# Patient Record
Sex: Female | Born: 1975 | Race: White | Hispanic: No | Marital: Married | Smoking: Never smoker
Health system: Southern US, Community
[De-identification: ages and names within clinical notes are randomized; demographics above are authoritative.]

## PROBLEM LIST (undated history)

## (undated) DIAGNOSIS — S0300XA Dislocation of jaw, unspecified side, initial encounter: Secondary | ICD-10-CM

## (undated) DIAGNOSIS — Z87442 Personal history of urinary calculi: Secondary | ICD-10-CM

## (undated) DIAGNOSIS — Z8489 Family history of other specified conditions: Secondary | ICD-10-CM

## (undated) DIAGNOSIS — R519 Headache, unspecified: Secondary | ICD-10-CM

## (undated) DIAGNOSIS — I10 Essential (primary) hypertension: Secondary | ICD-10-CM

## (undated) DIAGNOSIS — F419 Anxiety disorder, unspecified: Secondary | ICD-10-CM

## (undated) HISTORY — PX: FRACTURE SURGERY: SHX138

---

## 1999-09-26 ENCOUNTER — Other Ambulatory Visit: Admission: RE | Admit: 1999-09-26 | Discharge: 1999-09-26 | Payer: Self-pay | Admitting: Obstetrics and Gynecology

## 2000-10-01 ENCOUNTER — Other Ambulatory Visit: Admission: RE | Admit: 2000-10-01 | Discharge: 2000-10-01 | Payer: Self-pay | Admitting: Obstetrics and Gynecology

## 2001-10-24 ENCOUNTER — Other Ambulatory Visit: Admission: RE | Admit: 2001-10-24 | Discharge: 2001-10-24 | Payer: Self-pay | Admitting: Obstetrics and Gynecology

## 2002-11-18 ENCOUNTER — Other Ambulatory Visit: Admission: RE | Admit: 2002-11-18 | Discharge: 2002-11-18 | Payer: Self-pay | Admitting: Obstetrics and Gynecology

## 2003-12-03 ENCOUNTER — Other Ambulatory Visit: Admission: RE | Admit: 2003-12-03 | Discharge: 2003-12-03 | Payer: Self-pay | Admitting: Obstetrics and Gynecology

## 2009-09-29 ENCOUNTER — Ambulatory Visit (HOSPITAL_COMMUNITY): Admission: RE | Admit: 2009-09-29 | Discharge: 2009-09-29 | Payer: Self-pay | Admitting: Obstetrics and Gynecology

## 2010-07-13 ENCOUNTER — Encounter
Admission: RE | Admit: 2010-07-13 | Discharge: 2010-08-23 | Payer: Self-pay | Source: Home / Self Care | Attending: Obstetrics and Gynecology | Admitting: Obstetrics and Gynecology

## 2010-10-17 LAB — CBC
Hemoglobin: 13.9 g/dL (ref 12.0–15.0)
Platelets: 394 10*3/uL (ref 150–400)
RDW: 12.6 % (ref 11.5–15.5)
WBC: 9.4 10*3/uL (ref 4.0–10.5)

## 2010-10-30 ENCOUNTER — Inpatient Hospital Stay (HOSPITAL_COMMUNITY)
Admission: AD | Admit: 2010-10-30 | Discharge: 2010-11-04 | DRG: 766 | Disposition: A | Discharge status: Home / Self Care | Payer: 59 | Source: Ambulatory Visit | Attending: Obstetrics and Gynecology | Admitting: Obstetrics and Gynecology

## 2010-10-30 DIAGNOSIS — O324XX0 Maternal care for high head at term, not applicable or unspecified: Secondary | ICD-10-CM | POA: Diagnosis present

## 2010-10-30 DIAGNOSIS — Z22330 Carrier of Group B streptococcus: Secondary | ICD-10-CM

## 2010-10-30 DIAGNOSIS — O99892 Other specified diseases and conditions complicating childbirth: Secondary | ICD-10-CM | POA: Diagnosis present

## 2010-10-30 DIAGNOSIS — O99814 Abnormal glucose complicating childbirth: Secondary | ICD-10-CM | POA: Diagnosis present

## 2010-10-30 LAB — CBC
Hemoglobin: 13.2 g/dL (ref 12.0–15.0)
RBC: 4.72 MIL/uL (ref 3.87–5.11)

## 2010-10-30 LAB — RPR: RPR Ser Ql: NONREACTIVE

## 2010-11-01 ENCOUNTER — Other Ambulatory Visit: Payer: Self-pay | Admitting: Obstetrics and Gynecology

## 2010-11-01 LAB — GLUCOSE, CAPILLARY
Glucose-Capillary: 121 mg/dL — ABNORMAL HIGH (ref 70–99)
Glucose-Capillary: 86 mg/dL (ref 70–99)
Glucose-Capillary: 90 mg/dL (ref 70–99)
Glucose-Capillary: 128 mg/dL — ABNORMAL HIGH (ref 70–99)
Glucose-Capillary: 92 mg/dL (ref 70–99)

## 2010-11-02 ENCOUNTER — Inpatient Hospital Stay (HOSPITAL_COMMUNITY): Admission: AD | Admit: 2010-11-02 | Payer: Self-pay | Source: Home / Self Care | Admitting: Obstetrics and Gynecology

## 2010-11-02 LAB — CBC
Hemoglobin: 11.3 g/dL — ABNORMAL LOW (ref 12.0–15.0)
RBC: 4.11 MIL/uL (ref 3.87–5.11)
WBC: 14.8 10*3/uL — ABNORMAL HIGH (ref 4.0–10.5)

## 2010-11-09 NOTE — Discharge Summary (Addendum)
Dawn Thompson, Dawn Thompson            ACCOUNT NO.:  [id number]  MEDICAL RECORD NO.:  [id number]           PATIENT TYPE:  I  LOCATION:  9126                          [facility]  PHYSICIAN:  Maxie Better, M.D.DATE OF BIRTH:  01-02-1976  DATE OF ADMISSION:  10/30/2010 DATE OF DISCHARGE:  11/04/2010                              DISCHARGE SUMMARY   ADMISSION DIAGNOSES: 1. Term intrauterine pregnancy at 39 weeks. 2. Class A1 gestational diabetes. 3. Positive GBS.  DISCHARGE DIAGNOSES: 1. Status post C-section and delivery of a viable female infant. 2. Resolving class A1 gestational diabetes. 3. Fluid retention, mild.  The patient is a G2, P 0-0-1-0, at 39 weeks with an [facility] of November 02, 2010.  She has received prenatal care at [facility] since 9 weeks' gestation with Dr. Cherly Hensen as primary provider.  PRENATAL LABORATORY DATA:  Rh positive, rubella immune, HIV nonreactive, RPR nonreactive, hepatitis B nonreactive, GBS positive, GTT abnormal 1- hour 222, 2-hour 201, 3-hour 141.  Prenatal course has been complicated by gestational diabetes and the patient has been evaluated by a nutritionist and no medications have been administered.  Status has remained diet controlled.  MEDICAL HISTORY:  Significant for migraines.  ALLERGIES:  No known allergies.  CURRENT MEDICATIONS: 1. Prenatal vitamin p.o. daily. 2. Daily Accu-Cheks.  The patient is admitted on October 30, 2010, for induction of labor. Admission labs on October 30, 2010, include CBC, WBC 9.4, hemoglobin 13.2, hematocrit 39.6, and platelets 241.  The patient is admitted for induction of labor and augmented with Pitocin.  Labor progressed to fully dilated and the patient attempted pushing x3 hours. Due to failure to descend, decision was made to proceed with primary C-section.  C- section was performed on November 01, 2010, by Dr. Cherly Hensen.  A viable female infant was delivered weighing 7 pounds and 9 ounces with Apgars of 9  and 9.  Infant was then transferred to [facility] in stable condition.  Postop course has been uneventful with resolving class A1 gestational diabetes.  Postpartum CBC on November 02, 2010, WBC 14.8, hemoglobin 11.3, hematocrit 34.8, and platelets 208.  Vital signs have remained stable and the patient has remained afebrile.  Abdominal incision is low transverse and closed with staples.  We will plan staple removal today prior to discharge home and we will apply benzoin and Steri-Strips.  Newborn infant is breast and bottle feeding. Circumcision has been completed.  Status at the time of discharge for the patient is stable.  She is tolerating a regular diet, voiding without any problems, positive for flatus.  Activity ad lib.  She has been given the [facility] postpartum booklet and advised to follow up for routine postpartum visit in 6 weeks.  MEDICATIONS AT DISCHARGE: 1. Percocet 5/325 one to two p.o. q.6 h. p.r.n. pain, dispensed #30,     no refill. 2. HCTZ 25 mg p.o. daily x5 days.  Dawn Lindau, NP   ______________________________ Maxie Better, M.D.    JF/MEDQ  D:  11/04/2010  T:  11/05/2010  Job:  [id number]  Electronically Signed by Nena Jordan COUSINS M.D. on 11/09/2010 03:11:13 PM Electronically Signed by  Aroura Vasudevan  on 11/11/2010 08:28:34 AM

## 2010-11-09 NOTE — Op Note (Signed)
  NAME:  Dawn Thompson, Dawn Thompson            ACCOUNT NO.:  [id number]  MEDICAL RECORD NO.:  [id number]           PATIENT TYPE:  I  LOCATION:  9126                          [facility]  PHYSICIAN:  Maxie Better, M.D.DATE OF BIRTH:  [birth year]  DATE OF PROCEDURE:  10/30/2010 DATE OF DISCHARGE:                              OPERATIVE REPORT   PREOPERATIVE DIAGNOSIS:  Arrest of descent, class A1 gestational diabetes, term gestation.  PROCEDURE:  Primary cesarean section, Kerr hysterotomy.  POSTOPERATIVE DIAGNOSIS:  Arrest of descent, direct occiput posterior presentation, class A1 gestational diabetes, term gestation.  ANESTHESIA:  Epidural.  SURGEON:  Maxie Better, MD  ASSISTANT:  Arlan Organ, CNM  PROCEDURE:  Under adequate epidural anesthesia, the patient was placed in the supine position with a left lateral tilt.  She was sterilely prepped and draped in usual fashion.  Indwelling Foley catheter was already in place.  A 0.25% Marcaine was injected along the planned Pfannenstiel skin incision.  Pfannenstiel skin incision was done and carried down to the rectus fascia.  The rectus fascia was then bluntly and sharply dissected rectus muscle in superior inferior fashion.  The rectus muscles split in midline.  The parietal peritoneum was then bluntly and extended.  The vesicouterine peritoneum was opened transversely.  The bladder was then bluntly dissected off the lower uterine segment displaced inferiorly with a bladder retractor.  A curvilinear low transverse uterine incision was then made and extended with bandage scissors.  Subsequent delivery of a live female from the direct occiput posterior presentation was accomplished.  Baby was bulb suctioned from the abdomen.  Cord was clamped and cut.  The baby was transferred to the awaiting pediatrician who assigned Apgars of 9 at 9 and 1 at 5 minutes.  The placenta was manually removed intact.  Uterine cavity was cleaned of  debris.  Uterine incision was closed in two layer, the first layer using 0 Monocryl running locked stitch, second layer was imbricating using 0 Monocryl suture.  Normal tubes and ovaries were noted bilaterally.  The abdomen was copiously irrigated and suctioned of debris.  The parietal peritoneum was closed with 2-0 Vicryl.  Rectus fascia was closed with 0 Vicryl x2.  The subcutaneous area was irrigated, small bleeders cauterized.  Interrupted 2-0 plain sutures placed and the skin approximated with Ethicon staples.  SPECIMEN:  Placenta sent to pathology.  ESTIMATED BLOOD LOSS:  700 mL.  INTRAOPERATIVE FLUID: 1500 ml.  URINE OUTPUT:  450 mL.  Sponge and instrument counts x2 was correct.  Complications none. Weight of the baby was 7 pounds 9 ounces.  The patient tolerated procedure well, was transferred to recovery room in stable condition.     Maxie Better, M.D.     George/MEDQ  D:  11/01/2010  T:  11/02/2010  Job:  [id number]  Electronically Signed by Nena Jordan Mitsy Owen M.D. on 11/09/2010 03:15:23 PM

## 2010-12-14 ENCOUNTER — Other Ambulatory Visit: Payer: Self-pay | Admitting: Obstetrics and Gynecology

## 2020-01-12 ENCOUNTER — Ambulatory Visit: Payer: Self-pay | Admitting: General Surgery

## 2020-01-27 NOTE — Patient Instructions (Signed)
DUE TO COVID-19 ONLY ONE VISITOR IS ALLOWED TO COME WITH YOU AND STAY IN THE WAITING ROOM ONLY DURING PRE OP AND PROCEDURE DAY OF SURGERY. TWO VISITOR MAY VISIT WITH YOU AFTER SURGERY IN YOUR PRIVATE ROOM DURING VISITING HOURS ONLY!  YOU NEED TO HAVE A COVID 19 TEST ON_______ 02-03-20 @_______ , THIS TEST MUST BE DONE BEFORE SURGERY, COME  [address], [place] , .  [facility]) ONCE YOUR COVID TEST IS COMPLETED, PLEASE BEGIN THE QUARANTINE INSTRUCTIONS AS OUTLINED IN YOUR HANDOUT.                Dawn Thompson  01/27/2020   Your procedure is scheduled on: 02-06-20   Report to [facility] Main  Entrance   Report to short stay  at      0530  AM     Call this number if you have problems the morning of surgery [phone]    Remember: NO SOLID FOOD AFTER MIDNIGHT THE NIGHT PRIOR TO SURGERY. NOTHING BY MOUTH EXCEPT CLEAR LIQUIDS UNTIL 0430 am . PLEASE FINISH ENSURE DRINK PER SURGEON ORDER  WHICH NEEDS TO BE COMPLETED AT     0430 am then nothing by mouth  .   BRUSH YOUR TEETH MORNING OF SURGERY AND RINSE YOUR MOUTH OUT, NO CHEWING GUM CANDY OR MINTS.     Take these medicines the morning of surgery with A SIP OF WATER: paxil                                 You may not have any metal on your body including hair pins and              piercings  Do not wear jewelry, make-up, lotions, powders or perfumes, deodorant             Do not wear nail polish on your fingernails.  Do not shave  48 hours prior to surgery.          Do not bring valuables to the hospital. [facility] IS NOT             RESPONSIBLE   FOR VALUABLES.  Contacts, dentures or bridgework may not be worn into surgery.      Patients discharged the day of surgery will not be allowed to drive home. IF YOU ARE HAVING SURGERY AND GOING HOME THE SAME DAY, YOU MUST HAVE AN ADULT TO DRIVE YOU HOME AND BE WITH YOU FOR 24 HOURS. YOU MAY GO HOME BY TAXI OR UBER OR ORTHERWISE, BUT AN ADULT MUST  ACCOMPANY YOU HOME AND STAY WITH YOU FOR 24 HOURS.  Name and phone number of your driver:  Special Instructions: N/A              Please read over the following fact sheets you were given: _____________________________________________________________________             [facility] - Preparing for Surgery Before surgery, you can play an important role.  Because skin is not sterile, your skin needs to be as free of germs as possible.  You can reduce the number of germs on your skin by washing with CHG (chlorahexidine gluconate) soap before surgery.  CHG is an antiseptic cleaner which kills germs and bonds with the skin to continue killing germs even after washing. Please DO NOT use if you have an allergy to CHG or antibacterial soaps.  If your skin becomes reddened/irritated stop using the CHG and inform your nurse when you arrive at Short Stay. Do not shave (including legs and underarms) for at least 48 hours prior to the first CHG shower.  You may shave your face/neck. Please follow these instructions carefully:  1.  Shower with CHG Soap the night before surgery and the  morning of Surgery.  2.  If you choose to wash your hair, wash your hair first as usual with your  normal  shampoo.  3.  After you shampoo, rinse your hair and body thoroughly to remove the  shampoo.                           4.  Use CHG as you would any other liquid soap.  You can apply chg directly  to the skin and wash                       Gently with a scrungie or clean washcloth.  5.  Apply the CHG Soap to your body ONLY FROM THE NECK DOWN.   Do not use on face/ open                           Wound or open sores. Avoid contact with eyes, ears mouth and genitals (private parts).                       Wash face,  Genitals (private parts) with your normal soap.             6.  Wash thoroughly, paying special attention to the area where your surgery  will be performed.  7.  Thoroughly rinse your body with warm water from  the neck down.  8.  DO NOT shower/wash with your normal soap after using and rinsing off  the CHG Soap.                9.  Pat yourself dry with a clean towel.            10.  Wear clean pajamas.            11.  Place clean sheets on your bed the night of your first shower and do not  sleep with pets. Day of Surgery : Do not apply any lotions/deodorants the morning of surgery.  Please wear clean clothes to the hospital/surgery center.  FAILURE TO FOLLOW THESE INSTRUCTIONS MAY RESULT IN THE CANCELLATION OF YOUR SURGERY PATIENT SIGNATURE_________________________________  NURSE SIGNATURE__________________________________  ________________________________________________________________________

## 2020-01-27 NOTE — Progress Notes (Signed)
PCP - Francisca December Cardiologist - Saw Dr. Donnie Aho a couple years ago per pt.  for palpitations . No longer sees him was told it was anxiety   PPM/ICD -  Device Orders -  Rep Notified -   Chest x-ray -  EKG - 05-2019  Requested [facility] Stress Test -  ECHO -  Cardiac Cath -   Sleep Study -  CPAP -   Fasting Blood Sugar -  Checks Blood Sugar _____ times a day  Blood Thinner Instructions: Aspirin Instructions:  ERAS Protcol - PRE-SURGERY Ensure or G2-   COVID TEST-   Activity -Can walk a flight of stairs witout SOB, still works and does housework  Anesthesia review: BP elevated at preop 183/109 . Pt has losartan from her pcp that she has not started yet. We discussed she could be cancelled with elevated BP. She stated she would start her BP med as directed by her PCP.  Patient denies shortness of breath, fever, cough and chest pain at PAT appointment  NONE   All instructions explained to the patient, with a verbal understanding of the material. Patient agrees to go over the instructions while at home for a better understanding. Patient also instructed to self quarantine after being tested for COVID-19. The opportunity to ask questions was provided.

## 2020-01-28 ENCOUNTER — Encounter (HOSPITAL_COMMUNITY): Payer: Self-pay

## 2020-01-28 ENCOUNTER — Encounter (HOSPITAL_COMMUNITY)
Admission: RE | Admit: 2020-01-28 | Discharge: 2020-01-28 | Disposition: A | Discharge status: Home / Self Care | Payer: 59 | Source: Ambulatory Visit | Attending: General Surgery | Admitting: General Surgery

## 2020-01-28 ENCOUNTER — Other Ambulatory Visit: Payer: Self-pay

## 2020-01-28 DIAGNOSIS — Z01812 Encounter for preprocedural laboratory examination: Secondary | ICD-10-CM | POA: Diagnosis not present

## 2020-01-28 HISTORY — DX: Family history of other specified conditions: Z84.89

## 2020-01-28 HISTORY — DX: Essential (primary) hypertension: I10

## 2020-01-28 HISTORY — DX: Headache, unspecified: R51.9

## 2020-01-28 HISTORY — DX: Personal history of urinary calculi: Z87.442

## 2020-01-28 HISTORY — DX: Dislocation of jaw, unspecified side, initial encounter: S03.00XA

## 2020-01-28 HISTORY — DX: Anxiety disorder, unspecified: F41.9

## 2020-01-28 LAB — COMPREHENSIVE METABOLIC PANEL
ALT: 32 U/L (ref 0–44)
AST: 31 U/L (ref 15–41)
Albumin: 3.9 g/dL (ref 3.5–5.0)
Alkaline Phosphatase: 80 U/L (ref 38–126)
Anion gap: 7 (ref 5–15)
BUN: 9 mg/dL (ref 6–20)
CO2: 28 mmol/L (ref 22–32)
Calcium: 9.1 mg/dL (ref 8.9–10.3)
Chloride: 102 mmol/L (ref 98–111)
Creatinine, Ser: 0.71 mg/dL (ref 0.44–1.00)
GFR calc Af Amer: >60 mL/min (ref >60)
GFR calc non Af Amer: >60 mL/min (ref >60)
Glucose, Bld: 121 mg/dL — ABNORMAL HIGH (ref 70–99)
Potassium: 3.9 mmol/L (ref 3.5–5.1)
Sodium: 137 mmol/L (ref 135–145)
Total Bilirubin: 0.6 mg/dL (ref 0.3–1.2)
Total Protein: 7.7 g/dL (ref 6.5–8.1)

## 2020-01-28 LAB — CBC
HCT: 43.1 % (ref 36.0–46.0)
Hemoglobin: 14.2 g/dL (ref 12.0–15.0)
MCH: 29.2 pg (ref 26.0–34.0)
MCHC: 32.9 g/dL (ref 30.0–36.0)
MCV: 88.5 fL (ref 80.0–100.0)
Platelets: 285 10*3/uL (ref 150–400)
RBC: 4.87 MIL/uL (ref 3.87–5.11)
RDW: 12.4 % (ref 11.5–15.5)
WBC: 7.8 10*3/uL (ref 4.0–10.5)
nRBC: 0 % (ref 0.0–0.2)

## 2020-01-29 NOTE — Progress Notes (Signed)
Requested EKG [facility] medical

## 2020-02-02 NOTE — Progress Notes (Signed)
[facility] was called again to request EKG trace.As per them,medical record can not get the trace copy to send it.An other for an EKG the day of surgery will be place.

## 2020-02-03 ENCOUNTER — Other Ambulatory Visit (HOSPITAL_COMMUNITY)
Admission: RE | Admit: 2020-02-03 | Discharge: 2020-02-03 | Disposition: A | Discharge status: Home / Self Care | Payer: 59 | Source: Ambulatory Visit | Attending: General Surgery | Admitting: General Surgery

## 2020-02-03 DIAGNOSIS — Z20822 Contact with and (suspected) exposure to covid-19: Secondary | ICD-10-CM | POA: Diagnosis not present

## 2020-02-03 LAB — SARS CORONAVIRUS 2 (TAT 6-24 HRS): SARS Coronavirus 2: NEGATIVE

## 2020-02-05 ENCOUNTER — Encounter (HOSPITAL_COMMUNITY): Payer: Self-pay | Admitting: General Surgery

## 2020-02-05 NOTE — Anesthesia Preprocedure Evaluation (Addendum)
Anesthesia Evaluation  Patient identified by MRN, date of birth, ID band Patient awake    Reviewed: Allergy & Precautions, NPO status , Patient's Chart, lab work & pertinent test results  Airway Mallampati: II  TM Distance: >3 FB Neck ROM: Full    Dental no notable dental hx. (+) Teeth Intact, Dental Advisory Given   Pulmonary neg pulmonary ROS,    Pulmonary exam normal breath sounds clear to auscultation       Cardiovascular hypertension, Pt. on medications Normal cardiovascular exam Rhythm:Regular Rate:Normal     Neuro/Psych  Headaches, Anxiety    GI/Hepatic negative GI ROS, Neg liver ROS,   Endo/Other  negative endocrine ROS  Renal/GU negative Renal ROS     Musculoskeletal negative musculoskeletal ROS (+)   Abdominal (+) + obese,   Peds  Hematology negative hematology ROS (+)   Anesthesia Other Findings   Reproductive/Obstetrics negative OB ROS                           Anesthesia Physical Anesthesia Plan  ASA: II  Anesthesia Plan: General   Post-op Pain Management:    Induction: Intravenous  PONV Risk Score and Plan: 4 or greater and Treatment may vary due to age or medical condition, Dexamethasone, Ondansetron, Midazolam and Scopolamine patch - Pre-op  Airway Management Planned: Oral ETT  Additional Equipment: None  Intra-op Plan:   Post-operative Plan: Extubation in OR  Informed Consent: I have reviewed the patients History and Physical, chart, labs and discussed the procedure including the risks, benefits and alternatives for the proposed anesthesia with the patient or authorized representative who has indicated his/her understanding and acceptance.     Dental advisory given  Plan Discussed with:   Anesthesia Plan Comments: (See APP note by Joslyn Hy, FNP  [place] w lidocaine infusion)      Anesthesia Quick Evaluation

## 2020-02-05 NOTE — Progress Notes (Signed)
Anesthesia Chart Review:   Case: [id number] Date/Time: 02/06/20 0700   Procedure: XI ROBOTIC ASSISTED LAPAROSCOPIC CHOLECYSTECTOMY with possible intraoperative cholangiogram (N/A )   Anesthesia type: General   Pre-op diagnosis: symptomatic cholelithiasis   Location: [facility] ROOM 02 / [facility]   Surgeons: Gaynelle Adu, MD      DISCUSSION: Pt is a 44 year old with recent dx HTN.  Has not yet started Rx for losartan  BP at pre-surgical testing was 169/111 and 183/109 on recheck.  Pt instructed to start losartan as prescribed. Pt is aware surgery could be cancelled for elevated BP  EKG will be obtained day of surgery    VS: BP (!) 183/109    Pulse 90    Temp 36.8 C (Oral)    Resp 16    Ht 5\' 2"  (1.575 m)    Wt 86.2 kg    LMP  (Approximate) Comment: Has IUD   SpO2 100%    BMI 34.75 kg/m   PROVIDERS: - PCP is , MD   LABS: Labs reviewed: Acceptable for surgery. (all labs ordered are listed, but only abnormal results are displayed)  Labs Reviewed  COMPREHENSIVE METABOLIC PANEL - Abnormal; Notable for the following components:      Result Value   Glucose, Bld 121 (*)    All other components within normal limits  CBC    EKG: PCP's office does not have. Will be obtained day of surgery    CV: N/A  Past Medical History:  Diagnosis Date   Anxiety    Family history of adverse reaction to anesthesia    Mom slow to wake up and BP drops   Headache    migraines   History of kidney stones    Hypertension    Has not started medication yet   TMJ (dislocation of temporomandibular joint)     Past Surgical History:  Procedure Laterality Date   CESAREAN SECTION     FRACTURE SURGERY     jaw for TMJ    MEDICATIONS:  levonorgestrel (MIRENA) 20 MCG/24HR IUD   Benzoyl Peroxide (PANOXYL FOAMING WASH) 10 % LIQD   clindamycin (CLEOCIN T) 1 % lotion   losartan (COZAAR) 50 MG tablet   PARoxetine (PAXIL) 20 MG tablet   No current facility-administered medications  for this encounter.    If BP and EKG acceptable day of surgery, I anticipate pt can proceed with surgery as scheduled.  Eliezer Lofts, PhD, FNP-BC [facility]/Anesthesiology Phone: [phone] 02/05/2020 12:33 PM

## 2020-02-06 ENCOUNTER — Ambulatory Visit (HOSPITAL_COMMUNITY)
Admission: RE | Admit: 2020-02-06 | Discharge: 2020-02-06 | Disposition: A | Discharge status: Home / Self Care | Payer: 59 | Attending: General Surgery | Admitting: General Surgery

## 2020-02-06 ENCOUNTER — Other Ambulatory Visit: Payer: Self-pay

## 2020-02-06 ENCOUNTER — Ambulatory Visit (HOSPITAL_COMMUNITY): Payer: 59 | Admitting: Physician Assistant

## 2020-02-06 ENCOUNTER — Encounter (HOSPITAL_COMMUNITY)
Admission: RE | Disposition: A | Discharge status: Home / Self Care | Payer: Self-pay | Source: Home / Self Care | Attending: General Surgery

## 2020-02-06 ENCOUNTER — Ambulatory Visit (HOSPITAL_COMMUNITY): Payer: 59 | Admitting: Anesthesiology

## 2020-02-06 ENCOUNTER — Encounter (HOSPITAL_COMMUNITY): Payer: Self-pay | Admitting: General Surgery

## 2020-02-06 DIAGNOSIS — F419 Anxiety disorder, unspecified: Secondary | ICD-10-CM | POA: Insufficient documentation

## 2020-02-06 DIAGNOSIS — Z888 Allergy status to other drugs, medicaments and biological substances status: Secondary | ICD-10-CM | POA: Diagnosis not present

## 2020-02-06 DIAGNOSIS — R519 Headache, unspecified: Secondary | ICD-10-CM | POA: Insufficient documentation

## 2020-02-06 DIAGNOSIS — Z6834 Body mass index (BMI) 34.0-34.9, adult: Secondary | ICD-10-CM | POA: Insufficient documentation

## 2020-02-06 DIAGNOSIS — E669 Obesity, unspecified: Secondary | ICD-10-CM | POA: Diagnosis not present

## 2020-02-06 DIAGNOSIS — K828 Other specified diseases of gallbladder: Secondary | ICD-10-CM | POA: Diagnosis not present

## 2020-02-06 DIAGNOSIS — Z87442 Personal history of urinary calculi: Secondary | ICD-10-CM | POA: Insufficient documentation

## 2020-02-06 DIAGNOSIS — K8010 Calculus of gallbladder with chronic cholecystitis without obstruction: Secondary | ICD-10-CM | POA: Insufficient documentation

## 2020-02-06 DIAGNOSIS — I10 Essential (primary) hypertension: Secondary | ICD-10-CM | POA: Insufficient documentation

## 2020-02-06 LAB — PREGNANCY, URINE: Preg Test, Ur: NEGATIVE

## 2020-02-06 SURGERY — CHOLECYSTECTOMY, ROBOT-ASSISTED, LAPAROSCOPIC
Anesthesia: General | Site: Abdomen

## 2020-02-06 MED ORDER — DROPERIDOL 2.5 MG/ML IJ SOLN: 0.6250 mg | Freq: Once | INTRAMUSCULAR | Status: DC | PRN

## 2020-02-06 MED ORDER — PHENYLEPHRINE 40 MCG/ML (10ML) SYRINGE FOR IV PUSH (FOR BLOOD PRESSURE SUPPORT)
PREFILLED_SYRINGE | INTRAVENOUS | Status: AC
  Filled 2020-02-06: qty 10

## 2020-02-06 MED ORDER — LIDOCAINE HCL 2 % IJ SOLN
INTRAMUSCULAR | Status: AC
  Filled 2020-02-06: qty 20

## 2020-02-06 MED ORDER — LIDOCAINE 2% (20 MG/ML) 5 ML SYRINGE
INTRAMUSCULAR | Status: DC | PRN
  Administered 2020-02-06: 80 mg via INTRAVENOUS
  Administered 2020-02-06: 1.5 mg/kg/h via INTRAVENOUS

## 2020-02-06 MED ORDER — DEXMEDETOMIDINE HCL IN NACL 200 MCG/50ML IV SOLN
INTRAVENOUS | Status: AC
  Filled 2020-02-06: qty 50

## 2020-02-06 MED ORDER — PROPOFOL 10 MG/ML IV BOLUS
INTRAVENOUS | Status: AC
  Filled 2020-02-06: qty 20

## 2020-02-06 MED ORDER — ONDANSETRON HCL 4 MG/2ML IJ SOLN: 4.0000 mg | Freq: Once | INTRAMUSCULAR | Status: DC | PRN

## 2020-02-06 MED ORDER — LACTATED RINGERS IV SOLN: INTRAVENOUS | Status: DC

## 2020-02-06 MED ORDER — HYDROMORPHONE HCL 1 MG/ML IJ SOLN
INTRAMUSCULAR | Status: AC
  Administered 2020-02-06: 0.5 mg via INTRAVENOUS
  Filled 2020-02-06: qty 1

## 2020-02-06 MED ORDER — OXYCODONE HCL 5 MG PO TABS: 5.0000 mg | ORAL_TABLET | Freq: Four times a day (QID) | ORAL | 0 refills | Status: DC | PRN

## 2020-02-06 MED ORDER — FENTANYL CITRATE (PF) 250 MCG/5ML IJ SOLN
INTRAMUSCULAR | Status: AC
  Filled 2020-02-06: qty 5

## 2020-02-06 MED ORDER — DEXAMETHASONE SODIUM PHOSPHATE 4 MG/ML IJ SOLN: 4.0000 mg | INTRAMUSCULAR | Status: DC

## 2020-02-06 MED ORDER — DEXMEDETOMIDINE HCL IN NACL 200 MCG/50ML IV SOLN
INTRAVENOUS | Status: DC | PRN
Start: 2020-02-06 — End: 2020-02-06
  Administered 2020-02-06: 8 ug via INTRAVENOUS
  Administered 2020-02-06: 4 ug via INTRAVENOUS

## 2020-02-06 MED ORDER — DEXAMETHASONE SODIUM PHOSPHATE 10 MG/ML IJ SOLN
INTRAMUSCULAR | Status: DC | PRN
  Administered 2020-02-06: 10 mg via INTRAVENOUS

## 2020-02-06 MED ORDER — EPHEDRINE SULFATE-NACL 50-0.9 MG/10ML-% IV SOSY
PREFILLED_SYRINGE | INTRAVENOUS | Status: DC | PRN
  Administered 2020-02-06: 5 mg via INTRAVENOUS
  Administered 2020-02-06: 10 mg via INTRAVENOUS

## 2020-02-06 MED ORDER — PHENYLEPHRINE 40 MCG/ML (10ML) SYRINGE FOR IV PUSH (FOR BLOOD PRESSURE SUPPORT)
PREFILLED_SYRINGE | INTRAVENOUS | Status: DC | PRN
  Administered 2020-02-06 (×4): 80 ug via INTRAVENOUS

## 2020-02-06 MED ORDER — DEXAMETHASONE SODIUM PHOSPHATE 10 MG/ML IJ SOLN
INTRAMUSCULAR | Status: AC
  Filled 2020-02-06: qty 3

## 2020-02-06 MED ORDER — CHLORHEXIDINE GLUCONATE CLOTH 2 % EX PADS: 6.0000 | MEDICATED_PAD | Freq: Once | CUTANEOUS | Status: DC

## 2020-02-06 MED ORDER — ENSURE PRE-SURGERY PO LIQD
296.0000 mL | Freq: Once | ORAL | Status: DC
  Filled 2020-02-06: qty 296

## 2020-02-06 MED ORDER — ONDANSETRON HCL 4 MG/2ML IJ SOLN
INTRAMUSCULAR | Status: DC | PRN
  Administered 2020-02-06: 4 mg via INTRAVENOUS

## 2020-02-06 MED ORDER — ROCURONIUM BROMIDE 10 MG/ML (PF) SYRINGE
PREFILLED_SYRINGE | INTRAVENOUS | Status: AC
  Filled 2020-02-06: qty 20

## 2020-02-06 MED ORDER — BUPIVACAINE-EPINEPHRINE 0.25% -1:200000 IJ SOLN
INTRAMUSCULAR | Status: DC | PRN
  Administered 2020-02-06: 45 mL

## 2020-02-06 MED ORDER — 0.9 % SODIUM CHLORIDE (POUR BTL) OPTIME
TOPICAL | Status: DC | PRN
  Administered 2020-02-06: 1000 mL

## 2020-02-06 MED ORDER — BUPIVACAINE-EPINEPHRINE 0.25% -1:200000 IJ SOLN
INTRAMUSCULAR | Status: AC
  Filled 2020-02-06: qty 1

## 2020-02-06 MED ORDER — PROPOFOL 10 MG/ML IV BOLUS
INTRAVENOUS | Status: AC
  Filled 2020-02-06: qty 40

## 2020-02-06 MED ORDER — ACETAMINOPHEN 500 MG PO TABS
ORAL_TABLET | ORAL | Status: AC
  Administered 2020-02-06: 1000 mg via ORAL
  Filled 2020-02-06: qty 2

## 2020-02-06 MED ORDER — SODIUM CHLORIDE 0.9 % IV SOLN
INTRAVENOUS | Status: AC
  Filled 2020-02-06: qty 2

## 2020-02-06 MED ORDER — GABAPENTIN 300 MG PO CAPS: 300.0000 mg | ORAL_CAPSULE | ORAL | Status: AC

## 2020-02-06 MED ORDER — LIDOCAINE 2% (20 MG/ML) 5 ML SYRINGE
INTRAMUSCULAR | Status: AC
  Filled 2020-02-06: qty 15

## 2020-02-06 MED ORDER — OXYCODONE HCL 5 MG PO TABS: 5.0000 mg | ORAL_TABLET | Freq: Once | ORAL | Status: AC | PRN

## 2020-02-06 MED ORDER — ORAL CARE MOUTH RINSE: 15.0000 mL | Freq: Once | OROMUCOSAL | Status: AC

## 2020-02-06 MED ORDER — MIDAZOLAM HCL 2 MG/2ML IJ SOLN
INTRAMUSCULAR | Status: AC
  Filled 2020-02-06: qty 2

## 2020-02-06 MED ORDER — MIDAZOLAM HCL 5 MG/5ML IJ SOLN
INTRAMUSCULAR | Status: DC | PRN
  Administered 2020-02-06: 2 mg via INTRAVENOUS

## 2020-02-06 MED ORDER — LACTATED RINGERS IR SOLN
Status: DC | PRN
  Administered 2020-02-06: 1000 mL

## 2020-02-06 MED ORDER — OXYCODONE HCL 5 MG/5ML PO SOLN: 5.0000 mg | Freq: Once | ORAL | Status: AC | PRN

## 2020-02-06 MED ORDER — GABAPENTIN 300 MG PO CAPS
ORAL_CAPSULE | ORAL | Status: AC
  Administered 2020-02-06: 300 mg via ORAL
  Filled 2020-02-06: qty 1

## 2020-02-06 MED ORDER — SODIUM CHLORIDE 0.9 % IV SOLN
2.0000 g | INTRAVENOUS | Status: AC
  Administered 2020-02-06: 2 g via INTRAVENOUS

## 2020-02-06 MED ORDER — SCOPOLAMINE 1 MG/3DAYS TD PT72: 1.0000 | MEDICATED_PATCH | TRANSDERMAL | Status: DC

## 2020-02-06 MED ORDER — FENTANYL CITRATE (PF) 100 MCG/2ML IJ SOLN
INTRAMUSCULAR | Status: DC | PRN
  Administered 2020-02-06: 100 ug via INTRAVENOUS
  Administered 2020-02-06: 50 ug via INTRAVENOUS
  Administered 2020-02-06 (×2): 25 ug via INTRAVENOUS

## 2020-02-06 MED ORDER — PROPOFOL 10 MG/ML IV BOLUS
INTRAVENOUS | Status: DC | PRN
  Administered 2020-02-06: 160 mg via INTRAVENOUS

## 2020-02-06 MED ORDER — SUGAMMADEX SODIUM 200 MG/2ML IV SOLN
INTRAVENOUS | Status: DC | PRN
  Administered 2020-02-06: 200 mg via INTRAVENOUS

## 2020-02-06 MED ORDER — KETOROLAC TROMETHAMINE 30 MG/ML IJ SOLN: 30.0000 mg | Freq: Once | INTRAMUSCULAR | Status: AC | PRN

## 2020-02-06 MED ORDER — SCOPOLAMINE 1 MG/3DAYS TD PT72
MEDICATED_PATCH | TRANSDERMAL | Status: AC
  Administered 2020-02-06: 1.5 mg via TRANSDERMAL
  Filled 2020-02-06: qty 1

## 2020-02-06 MED ORDER — ROCURONIUM BROMIDE 10 MG/ML (PF) SYRINGE
PREFILLED_SYRINGE | INTRAVENOUS | Status: DC | PRN
  Administered 2020-02-06: 50 mg via INTRAVENOUS
  Administered 2020-02-06: 20 mg via INTRAVENOUS

## 2020-02-06 MED ORDER — ONDANSETRON HCL 4 MG/2ML IJ SOLN
INTRAMUSCULAR | Status: AC
  Filled 2020-02-06: qty 6

## 2020-02-06 MED ORDER — ACETAMINOPHEN 500 MG PO TABS: 1000.0000 mg | ORAL_TABLET | ORAL | Status: AC

## 2020-02-06 MED ORDER — LIP MEDEX EX OINT
TOPICAL_OINTMENT | CUTANEOUS | Status: AC
  Filled 2020-02-06: qty 7

## 2020-02-06 MED ORDER — HYDROMORPHONE HCL 1 MG/ML IJ SOLN
0.2500 mg | INTRAMUSCULAR | Status: DC | PRN
  Administered 2020-02-06 (×2): 0.25 mg via INTRAVENOUS

## 2020-02-06 MED ORDER — CHLORHEXIDINE GLUCONATE 0.12 % MT SOLN
15.0000 mL | Freq: Once | OROMUCOSAL | Status: AC
  Administered 2020-02-06: 15 mL via OROMUCOSAL

## 2020-02-06 MED ORDER — KETOROLAC TROMETHAMINE 30 MG/ML IJ SOLN
INTRAMUSCULAR | Status: AC
  Administered 2020-02-06: 30 mg via INTRAVENOUS
  Filled 2020-02-06: qty 1

## 2020-02-06 MED ORDER — OXYCODONE HCL 5 MG PO TABS
ORAL_TABLET | ORAL | Status: AC
  Administered 2020-02-06: 5 mg via ORAL
  Filled 2020-02-06: qty 1

## 2020-02-06 MED ORDER — EPHEDRINE 5 MG/ML INJ
INTRAVENOUS | Status: AC
  Filled 2020-02-06: qty 10

## 2020-02-06 SURGICAL SUPPLY — 58 items
APPLIER CLIP 5 13 M/L LIGAMAX5 (MISCELLANEOUS)
BLADE SURG SZ11 CARB STEEL (BLADE) ×3 IMPLANT
BNDG ADH 1X3 SHEER STRL LF (GAUZE/BANDAGES/DRESSINGS) ×12 IMPLANT
CANNULA REDUC XI 12-8 STAPL (CANNULA) ×1
CANNULA REDUC XI 12-8MM STAPL (CANNULA) ×1
CANNULA REDUCER 12-8 DVNC XI (CANNULA) ×1 IMPLANT
CHLORAPREP W/TINT 26 (MISCELLANEOUS) ×3 IMPLANT
CLIP APPLIE 5 13 M/L LIGAMAX5 (MISCELLANEOUS) IMPLANT
CLIP VESOLOCK LG 6/CT PURPLE (CLIP) ×3 IMPLANT
CLOSURE STERI-STRIP 1/2X4 (GAUZE/BANDAGES/DRESSINGS) ×1
CLOSURE WOUND 1/2 X4 (GAUZE/BANDAGES/DRESSINGS) ×1
CLSR STERI-STRIP ANTIMIC 1/2X4 (GAUZE/BANDAGES/DRESSINGS) ×2 IMPLANT
COVER TIP SHEARS 8 DVNC (MISCELLANEOUS) IMPLANT
COVER TIP SHEARS 8MM DA VINCI (MISCELLANEOUS)
COVER WAND RF STERILE (DRAPES) ×3 IMPLANT
DECANTER SPIKE VIAL GLASS SM (MISCELLANEOUS) ×3 IMPLANT
DRAPE ARM DVNC X/XI (DISPOSABLE) ×4 IMPLANT
DRAPE COLUMN DVNC XI (DISPOSABLE) ×1 IMPLANT
DRAPE DA VINCI XI ARM (DISPOSABLE) ×8
DRAPE DA VINCI XI COLUMN (DISPOSABLE) ×2
DRSG TEGADERM 2-3/8X2-3/4 SM (GAUZE/BANDAGES/DRESSINGS) ×6 IMPLANT
ELECT REM PT RETURN 15FT ADLT (MISCELLANEOUS) ×3 IMPLANT
GLOVE BIO SURGEON STRL SZ7.5 (GLOVE) ×6 IMPLANT
GLOVE INDICATOR 8.0 STRL GRN (GLOVE) ×6 IMPLANT
GOWN STRL REUS W/TWL XL LVL3 (GOWN DISPOSABLE) ×6 IMPLANT
GRASPER SUT TROCAR 14GX15 (MISCELLANEOUS) IMPLANT
IRRIGATOR SUCT 8 DISP DVNC XI (IRRIGATION / IRRIGATOR) IMPLANT
IRRIGATOR SUCTION 8MM XI DISP (IRRIGATION / IRRIGATOR)
KIT BASIN OR (CUSTOM PROCEDURE TRAY) ×3 IMPLANT
KIT PROCEDURE DA VINCI SI (MISCELLANEOUS)
KIT PROCEDURE DVNC SI (MISCELLANEOUS) IMPLANT
KIT TURNOVER KIT A (KITS) IMPLANT
MANIFOLD NEPTUNE II (INSTRUMENTS) ×3 IMPLANT
NEEDLE HYPO 22GX1.5 SAFETY (NEEDLE) ×3 IMPLANT
NEEDLE INSUFFLATION 14GA 120MM (NEEDLE) ×3 IMPLANT
PACK CARDIOVASCULAR III (CUSTOM PROCEDURE TRAY) ×3 IMPLANT
POUCH RETRIEVAL ECOSAC 10 (ENDOMECHANICALS) ×1 IMPLANT
POUCH RETRIEVAL ECOSAC 10MM (ENDOMECHANICALS) ×2
SEAL CANN UNIV 5-8 DVNC XI (MISCELLANEOUS) ×4 IMPLANT
SEAL XI 5MM-8MM UNIVERSAL (MISCELLANEOUS) ×8
SET IRRIG TUBING LAPAROSCOPIC (IRRIGATION / IRRIGATOR) ×3 IMPLANT
SOL ANTI FOG 6CC (MISCELLANEOUS) ×1 IMPLANT
SOLUTION ANTI FOG 6CC (MISCELLANEOUS) ×2
SOLUTION ELECTROLUBE (MISCELLANEOUS) ×3 IMPLANT
SPONGE LAP 18X18 RF (DISPOSABLE) ×3 IMPLANT
STAPLER CANNULA SEAL DVNC XI (STAPLE) IMPLANT
STAPLER CANNULA SEAL XI (STAPLE)
STRIP CLOSURE SKIN 1/2X4 (GAUZE/BANDAGES/DRESSINGS) ×2 IMPLANT
SUT MNCRL AB 4-0 PS2 18 (SUTURE) ×3 IMPLANT
SYR 10ML ECCENTRIC (SYRINGE) ×3 IMPLANT
SYR 20ML LL LF (SYRINGE) ×3 IMPLANT
SYS RETRIEVAL 5MM INZII UNIV (BASKET) ×3
SYSTEM RETRIEVL 5MM INZII UNIV (BASKET) ×1 IMPLANT
TOWEL OR 17X26 10 PK STRL BLUE (TOWEL DISPOSABLE) ×3 IMPLANT
TOWEL OR NON WOVEN STRL DISP B (DISPOSABLE) ×3 IMPLANT
TRAY FOLEY MTR SLVR 16FR STAT (SET/KITS/TRAYS/PACK) IMPLANT
TROCAR BLADELESS OPT 5 100 (ENDOMECHANICALS) IMPLANT
TUBING INSUFFLATION 10FT LAP (TUBING) ×3 IMPLANT

## 2020-02-06 NOTE — Op Note (Signed)
JANNEL LYNNE [id number] [birth year] 02/06/2020  Robotic/Xi Cholecystectomy Procedure Note  Indications: This patient presents with symptomatic gallbladder disease and will undergo laparoscopic cholecystectomy.  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 The risks, benefits, complications, treatment options, and expected outcomes were discussed with the patient. The possibilities of reaction to medication, pulmonary aspiration, perforation of viscus, bleeding, recurrent infection, finding a normal gallbladder, the need for additional procedures, failure to diagnose a condition, the possible need to convert to an open procedure, and creating a complication requiring transfusion or operation were discussed with the patient. The likelihood of improving the patient's symptoms with return to their baseline status is good.  The patient and/or family concurred with the proposed plan, giving informed consent. The site of surgery properly noted. The patient was taken to Operating Room, identified as Gaylyn Cheers and the procedure verified as robotic Cholecystectomy with Intraoperative Cholangiogram. A Time Out was held and the above information confirmed. Antibiotic prophylaxis was administered.   Prior to the induction of general anesthesia, antibiotic prophylaxis was administered. General endotracheal anesthesia was then administered and tolerated well. After the induction, the abdomen was prepped with Chloraprep and draped in the sterile fashion. The patient was positioned in the supine position.  Local anesthetic agent was injected into the skin near the umbilicus and an incision made. We dissected down to the abdominal fascia with  blunt dissection.  The fascia was incised vertically and we entered the peritoneal cavity bluntly.  A pursestring suture of 0-Vicryl was placed around the fascial opening.  The Hasson cannula was inserted and secured with the stay suture.  Pneumoperitoneum was then created with CO2 and tolerated well without any adverse changes in the patient's vital signs.  The robotic camera was inserted and the abdominal cavity was surveilled.  There is no evidence of injury to surrounding structures.  I then placed three additional 8 mm robotic trochars in a horizontal line in the lower abdomen.  Two trochars were placed on the right side of the abdomen and one in the left lower quadrant all under direct visualization.  I then infiltrated local along the lateral abdominal walls as a tap block.    We positioned the patient in reverse Trendelenburg, tilted slightly to the patient's left.   We then brought in the [employer] I robot and deployed it for upper abdominal surgery from patient's right.  The robotic camera was inserted and the anatomy was targeted.  The robotic arms were attached to the robotic trochars.  A forced bipolar, prograsp, and robotic hook were inserted under direct visualization.  I then scrubbed out and went to the robotic console while my assistant stayed at the bedside.   The gallbladder was identified, the fundus grasped and retracted cephalad. Adhesions were lysed bluntly and with the electrocautery where indicated, taking care not to injure any adjacent organs or viscus. The infundibulum was grasped and retracted laterally, exposing the peritoneum overlying the triangle of Calot. This was then divided and exposed in a blunt fashion. A critical view of the cystic duct and cystic artery was obtained.  The cystic duct was clearly identified and bluntly dissected circumferentially.  My assistant performed instrument exchanges between the hook and the robotic hemoclips  The cystic duct was then  ligated with clips  and divided. The cystic artery (both a small anterior and posterior branch) which had been identified & dissected free was ligated with clips and divided as well.   The gallbladder was dissected from the liver bed in retrograde fashion with the electrocautery.  There was some spillage of bile from the gallbladder.  There was 1 stone that spilled.  The hook was removed and the assistant inserted a  Endo Catch bag & the gallbladder was removed and placed in an Endo Catch bag.  The gallbladder and Endo Catch bag were then removed through the umbilical port site by the assistant. The liver bed was irrigated and inspected. Hemostasis was achieved with the electrocautery. Copious irrigation was utilized and was repeatedly aspirated until clear.  The stone was suctioned out of the abdomen.  There was no other evidence of spilled gallstones.  The robot was undocked.  I scrubbed back in. the pursestring suture was used to close the umbilical fascia.  I did place an additional interrupted 0 Vicryl at the umbilical fascia using a PMI suture passer  We again inspected the right upper quadrant for hemostasis.  The umbilical closure was inspected and there was no air leak and nothing trapped within the closure. Pneumoperitoneum was released as we removed the trocars.  4-0 Monocryl was used to close the skin.   steri-strips, and clean dressings were applied. The patient was then extubated and brought to the recovery room in stable condition. Instrument, sponge, and needle counts were correct at closure and at the conclusion of the case.   Findings: Probable chronic Cholecystitis with Cholelithiasis  Estimated Blood Loss: Minimal         Drains: none         Specimens: Gallbladder           Complications: None; patient tolerated the procedure well.         Disposition: PACU - hemodynamically stable.         Condition: stable  Mary Sella. Andrey Campanile, MD, FACS General, Bariatric, & Minimally Invasive  Surgery [facility], [place]

## 2020-02-06 NOTE — Discharge Instructions (Signed)
[facility] LAPAROSCOPIC SURGERY: POST OP INSTRUCTIONS Always review your discharge instruction sheet given to you by the facility where your surgery was performed. IF YOU HAVE DISABILITY OR FAMILY LEAVE FORMS, YOU MUST BRING THEM TO THE OFFICE FOR PROCESSING.   DO NOT GIVE THEM TO YOUR DOCTOR.  PAIN CONTROL  1. First take acetaminophen (Tylenol) AND/or ibuprofen (Advil) to control your pain after surgery.  Follow directions on package.  Taking acetaminophen (Tylenol) and/or ibuprofen (Advil) regularly after surgery will help to control your pain and lower the amount of prescription pain medication you may need.  You should not take more than 3,000 mg (3 grams) of acetaminophen (Tylenol) in 24 hours.  You should not take ibuprofen (Advil), aleve, motrin, naprosyn or other NSAIDS if you have a history of stomach ulcers or chronic kidney disease.  2. A prescription for pain medication may be given to you upon discharge.  Take your pain medication as prescribed, if you still have uncontrolled pain after taking acetaminophen (Tylenol) or ibuprofen (Advil). 3. Use ice packs to help control pain. 4. If you need a refill on your pain medication, please contact your pharmacy.  They will contact our office to request authorization. Prescriptions will not be filled after 5pm or on week-ends.  HOME MEDICATIONS 5. Take your usually prescribed medications unless otherwise directed.  DIET 6. You should follow a light diet the first few days after arrival home.  Be sure to include lots of fluids daily. Avoid fatty, fried foods.   CONSTIPATION 7. It is common to experience some constipation after surgery and if you are taking pain medication.  Increasing fluid intake and taking a stool softener (such as Colace) will usually help or prevent this problem from occurring.  A mild laxative (Milk of Magnesia or Miralax) should be taken according to package instructions if there are no bowel  movements after 48 hours.  WOUND/INCISION CARE 8. Most patients will experience some swelling and bruising in the area of the incisions.  Ice packs will help.  Swelling and bruising can take several days to resolve.  9. Unless discharge instructions indicate otherwise, follow guidelines below  a. STERI-STRIPS - you may remove your outer bandages 48 hours after surgery, and you may shower at that time.  You have steri-strips (small skin tapes) in place directly over the incision.  These strips should be left on the skin for 7-10 days.   b. DERMABOND/SKIN GLUE - you may shower in 24 hours.  The glue will flake off over the next 2-3 weeks. 10. Any sutures or staples will be removed at the office during your follow-up visit.  ACTIVITIES 11. You may resume regular (light) daily activities beginning the next day--such as daily self-care, walking, climbing stairs--gradually increasing activities as tolerated.  You may have sexual intercourse when it is comfortable.  Refrain from any heavy lifting or straining until approved by your doctor. a. You may drive when you are no longer taking prescription pain medication, you can comfortably wear a seatbelt, and you can safely maneuver your car and apply brakes.  FOLLOW-UP 12. You should see your doctor in the office for a follow-up appointment approximately 2-3 weeks after your surgery.  You should have been given your post-op/follow-up appointment when your surgery was scheduled.  If you did not receive a post-op/follow-up appointment, make sure that you call for this appointment within a day or two after you arrive home to insure a convenient appointment time.  OTHER   INSTRUCTIONS 13.   WHEN TO CALL YOUR DOCTOR: 1. Fever over 101.0 2. Inability to urinate 3. Continued bleeding from incision. 4. Increased pain, redness, or drainage from the incision. 5. Increasing abdominal pain  The clinic staff is available to answer your questions during regular  business hours.  Please don't hesitate to call and ask to speak to one of the nurses for clinical concerns.  If you have a medical emergency, go to the nearest emergency room or call 911.  A surgeon from [facility] is always on call at the hospital. [address], [address], [place], [place]  [address] ? [address] [address], [place], [place]   [address] [phone] ? [phone] ? FAX [phone] Web site: [email or web address]  .........   Managing Your Pain After Surgery Without Opioids    Thank you for participating in our program to help patients manage their pain after surgery without opioids. This is part of our effort to provide you with the best care possible, without exposing you or your family to the risk that opioids pose.  What pain can I expect after surgery? You can expect to have some pain after surgery. This is normal. The pain is typically worse the day after surgery, and quickly begins to get better. Many studies have found that many patients are able to manage their pain after surgery with Over-the-Counter (OTC) medications such as Tylenol and Motrin. If you have a condition that does not allow you to take Tylenol or Motrin, notify your surgical team.  How will I manage my pain? The best strategy for controlling your pain after surgery is around the clock pain control with Tylenol (acetaminophen) and Motrin (ibuprofen or Advil). Alternating these medications with each other allows you to maximize your pain control. In addition to Tylenol and Motrin, you can use heating pads or ice packs on your incisions to help reduce your pain.  How will I alternate your regular strength over-the-counter pain medication? You will take a dose of pain medication every three hours. ; Start by taking 650 mg of Tylenol (2 pills of 325 mg) ; 3 hours later take 600 mg of Motrin (3 pills of 200 mg) ; 3 hours after taking the Motrin take 650 mg of Tylenol ; 3 hours  after that take 600 mg of Motrin.   - 1 -  See example - if your first dose of Tylenol is at 12:00 PM   12:00 PM Tylenol 650 mg (2 pills of 325 mg)  3:00 PM Motrin 600 mg (3 pills of 200 mg)  6:00 PM Tylenol 650 mg (2 pills of 325 mg)  9:00 PM Motrin 600 mg (3 pills of 200 mg)  Continue alternating every 3 hours   We recommend that you follow this schedule around-the-clock for at least 3 days after surgery, or until you feel that it is no longer needed. Use the table on the last page of this handout to keep track of the medications you are taking. Important: Do not take more than 3000mg of Tylenol or 1800mg of Motrin in a 24-hour period. Do not take ibuprofen/Motrin if you have a history of bleeding stomach ulcers, severe kidney disease, &/or actively taking a blood thinner  What if I still have pain? If you have pain that is not controlled with the over-the-counter pain medications (Tylenol and Motrin or Advil) you might have what we call "breakthrough" pain. You will receive a prescription for a small amount of an opioid   pain medication such as Oxycodone, Tramadol, or Tylenol with Codeine. Use these opioid pills in the first 24 hours after surgery if you have breakthrough pain. Do not take more than 1 pill every 4-6 hours.  If you still have uncontrolled pain after using all opioid pills, don't hesitate to call our staff using the number provided. We will help make sure you are managing your pain in the best way possible, and if necessary, we can provide a prescription for additional pain medication.   Day 1    Time  Name of Medication Number of pills taken  Amount of Acetaminophen  Pain Level   Comments  AM PM       AM PM       AM PM       AM PM       AM PM       AM PM       AM PM       AM PM       Total Daily amount of Acetaminophen Do not take more than  3,000 mg per day      Day 2    Time  Name of Medication Number of pills taken  Amount of Acetaminophen  Pain  Level   Comments  AM PM       AM PM       AM PM       AM PM       AM PM       AM PM       AM PM       AM PM       Total Daily amount of Acetaminophen Do not take more than  3,000 mg per day      Day 3    Time  Name of Medication Number of pills taken  Amount of Acetaminophen  Pain Level   Comments  AM PM       AM PM       AM PM       AM PM          AM PM       AM PM       AM PM       AM PM       Total Daily amount of Acetaminophen Do not take more than  3,000 mg per day      Day 4    Time  Name of Medication Number of pills taken  Amount of Acetaminophen  Pain Level   Comments  AM PM       AM PM       AM PM       AM PM       AM PM       AM PM       AM PM       AM PM       Total Daily amount of Acetaminophen Do not take more than  3,000 mg per day      Day 5    Time  Name of Medication Number of pills taken  Amount of Acetaminophen  Pain Level   Comments  AM PM       AM PM       AM PM       AM PM       AM PM       AM PM         AM PM       AM PM       Total Daily amount of Acetaminophen Do not take more than  3,000 mg per day       Day 6    Time  Name of Medication Number of pills taken  Amount of Acetaminophen  Pain Level  Comments  AM PM       AM PM       AM PM       AM PM       AM PM       AM PM       AM PM       AM PM       Total Daily amount of Acetaminophen Do not take more than  3,000 mg per day      Day 7    Time  Name of Medication Number of pills taken  Amount of Acetaminophen  Pain Level   Comments  AM PM       AM PM       AM PM       AM PM       AM PM       AM PM       AM PM       AM PM       Total Daily amount of Acetaminophen Do not take more than  3,000 mg per day        For additional information about how and where to safely dispose of unused opioid medications - https://www.morepowerfulnc.org  Disclaimer: This document contains information and/or instructional materials adapted  from [facility] for the typical patient with your condition. It does not replace medical advice from your health care provider because your experience may differ from that of the typical patient. Talk to your health care provider if you have any questions about this document, your condition or your treatment plan. Adapted from [facility]   

## 2020-02-06 NOTE — Anesthesia Procedure Notes (Signed)
Procedure Name: Intubation Date/Time: 02/06/2020 8:01 AM Performed by: Lavina Hamman, CRNA Pre-anesthesia Checklist: Patient identified, Emergency Drugs available, Suction available, Patient being monitored and Timeout performed Patient Re-evaluated:Patient Re-evaluated prior to induction Oxygen Delivery Method: Circle system utilized Preoxygenation: Pre-oxygenation with 100% oxygen Induction Type: IV induction Ventilation: Mask ventilation without difficulty Laryngoscope Size: Mac and 3 Grade View: Grade II Tube type: Oral Tube size: 7.0 mm Number of attempts: 1 Airway Equipment and Method: Stylet Placement Confirmation: ETT inserted through vocal cords under direct vision,  positive ETCO2,  CO2 detector and breath sounds checked- equal and bilateral Secured at: 21 cm Tube secured with: Tape Dental Injury: Teeth and Oropharynx as per pre-operative assessment  Comments: ATOI, hardware in mouth intact, no signs of bleeding from tooth pulled on Monday, 5 days ago.

## 2020-02-06 NOTE — Anesthesia Postprocedure Evaluation (Signed)
Anesthesia Post Note  Patient: Dawn Thompson  Procedure(s) Performed: XI ROBOTIC ASSISTED LAPAROSCOPIC CHOLECYSTECTOMY (N/A Abdomen)     Patient location during evaluation: PACU Anesthesia Type: General Level of consciousness: awake and alert Pain management: pain level controlled Vital Signs Assessment: post-procedure vital signs reviewed and stable Respiratory status: spontaneous breathing, nonlabored ventilation, respiratory function stable and patient connected to nasal cannula oxygen Cardiovascular status: blood pressure returned to baseline and stable Postop Assessment: no apparent nausea or vomiting Anesthetic complications: no   No complications documented.  Last Vitals:  Vitals:   02/06/20 1015 02/06/20 1030  BP: (!) 141/87 127/90  Pulse: 98 (!) 107  Resp: 15 13  Temp:    SpO2: 96% 94%    Last Pain:  Vitals:   02/06/20 1030  TempSrc:   PainSc: 5                  Trevor Iha

## 2020-02-06 NOTE — H&P (Signed)
CC: here for surgery  Requesting provider:   HPI: Dawn Thompson is an 44 y.o. female who is here for gallbladder surgery. Had some dental work earlier this week and started on a BP medication since her visit in clinic. O/w no changes to medical history.   The patient is a 43 year old female who presents for evaluation of gall stones. She is referred by Dr Thompson Grayer at [facility] for evaluation of cholelithiasis. I have the referring office notes which are reviewed but do not have a copy of her ultrasound her labs. The patient reports a several month history of intermittent right upper quadrant pain. It radiates to her back and shoulder at times. She's had 3 distinct severe episodes where the pain lasted for several hours and several smaller episodes. These episodes generally happen at night and lasted several hours. Occasional nausea but no vomiting. No fever or chills. She hasn't had an attack in 2 weeks. No weight loss. No acholic stools. No tobacco. Only prior surgical history C-section. She states that she had an ultrasound which showed gallstones. She denies any chest pain, chest pressure, source of breath, descending exertion, orthopnea, paroxysmal nocturnal dyspnea. There TIAs or amaurosis fugax. She hasn't found anything that makes these episodes better other than time  Past Medical History:  Diagnosis Date  . Anxiety   . Family history of adverse reaction to anesthesia    Mom slow to wake up and BP drops  . Headache    migraines  . History of kidney stones   . Hypertension    Has not started medication yet  . TMJ (dislocation of temporomandibular joint)     Past Surgical History:  Procedure Laterality Date  . CESAREAN SECTION    . FRACTURE SURGERY     jaw for TMJ    History reviewed. No pertinent family history.  Social:  reports that she has never smoked. She has never used smokeless tobacco. She reports current alcohol use. She reports that  she does not use drugs.  Allergies:  Allergies  Allergen Reactions  . Imipramine Hcl Hives    Medications: I have reviewed the patient's current medications.  Results for orders placed or performed during the hospital encounter of 02/06/20 (from the past 48 hour(s))  Pregnancy, urine     Status: None   Collection Time: 02/06/20  5:33 AM  Result Value Ref Range   Preg Test, Ur NEGATIVE NEGATIVE    Comment:        THE SENSITIVITY OF THIS METHODOLOGY IS >20 mIU/mL. Performed at [facility], [address], [place], [place] [address]     No results found.  ROS - all of the below systems have been reviewed with the patient and positives are indicated with bold text General: chills, fever or night sweats Eyes: blurry vision or double vision ENT: epistaxis or sore throat Allergy/Immunology: itchy/watery eyes or nasal congestion Hematologic/Lymphatic: bleeding problems, blood clots or swollen lymph nodes Endocrine: temperature intolerance or unexpected weight changes Breast: new or changing breast lumps or nipple discharge Resp: cough, shortness of breath, or wheezing CV: chest pain or dyspnea on exertion GI: as per HPI GU: dysuria, trouble voiding, or hematuria MSK: joint pain or joint stiffness Neuro: TIA or stroke symptoms Derm: pruritus and skin lesion changes Psych: anxiety and depression  PE Blood pressure (!) 172/105, pulse (!) 103, temperature 98.3 F (36.8 C), temperature source Oral, resp. rate 16, height 5\' 2"  (1.575 m), weight  86.2 kg, last menstrual period 08/09/2019, SpO2 100 %. Constitutional: NAD; conversant; no deformities Eyes: Moist conjunctiva; no lid lag; anicteric; PERRL Neck: Trachea midline; no thyromegaly Lungs: Normal respiratory effort; no tactile fremitus CV: RRR; no palpable thrills; no pitting edema GI: Abd soft, nt; no palpable hepatosplenomegaly MSK: Normal gait; no clubbing/cyanosis Psychiatric: Appropriate affect; alert  and oriented x3 Lymphatic: No palpable cervical or axillary lymphadenopathy  Results for orders placed or performed during the hospital encounter of 02/06/20 (from the past 48 hour(s))  Pregnancy, urine     Status: None   Collection Time: 02/06/20  5:33 AM  Result Value Ref Range   Preg Test, Ur NEGATIVE NEGATIVE    Comment:        THE SENSITIVITY OF THIS METHODOLOGY IS >20 mIU/mL. Performed at [facility], [address], [place], [place] [address]     No results found.  Imaging: reviewed  A/P: Dawn Thompson is an 44 y.o. female with symptomatic cholelithiasis   For robotic cholecystectomy Eras Iv abx All questions answered  Mary Sella. Andrey Campanile, MD, FACS General, Bariatric, & Minimally Invasive Surgery [facility], [place]

## 2020-02-06 NOTE — Transfer of Care (Signed)
Immediate Anesthesia Transfer of Care Note  Patient: Dawn Thompson  Procedure(s) Performed: Procedure(s): XI ROBOTIC ASSISTED LAPAROSCOPIC CHOLECYSTECTOMY (N/A)  Patient Location: PACU  Anesthesia Type:General  Level of Consciousness:  sedated, patient cooperative and responds to stimulation  Airway & Oxygen Therapy:Patient Spontanous Breathing and Patient connected to face mask oxgen  Post-op Assessment:  Report given to PACU RN and Post -op Vital signs reviewed and stable  Post vital signs:  Reviewed and stable  Last Vitals:  Vitals:   02/06/20 0600 02/06/20 0925  BP: (!) 172/105 (!) 168/109  Pulse:  97  Resp:  17  Temp:  (P) 36.7 C  SpO2:  95%    Complications: No apparent anesthesia complications

## 2020-02-09 LAB — SURGICAL PATHOLOGY

## 2020-12-02 ENCOUNTER — Other Ambulatory Visit: Payer: Self-pay

## 2020-12-02 ENCOUNTER — Encounter: Payer: Self-pay | Admitting: Plastic Surgery

## 2020-12-02 ENCOUNTER — Ambulatory Visit (INDEPENDENT_AMBULATORY_CARE_PROVIDER_SITE_OTHER): Payer: 59 | Admitting: Plastic Surgery

## 2020-12-02 VITALS — BP 176/111 | HR 111 | Ht 62.0 in | Wt 207.0 lb

## 2020-12-02 DIAGNOSIS — R2232 Localized swelling, mass and lump, left upper limb: Secondary | ICD-10-CM | POA: Diagnosis not present

## 2020-12-02 NOTE — Progress Notes (Signed)
   Referring Provider Eliezer Lofts, MD [address] [place],  [place] [address]   CC:  Chief Complaint  Patient presents with  . consult      Dawn Thompson is an 45 y.o. female.  HPI: Patient presents with tender swelling in the left axilla.  Is been present for about a year.  She was sent by her dermatologist.  They suspected lipoma.  She wants to see if it can be removed to improve her symptoms.  She is up-to-date on her mammograms which she has done at her OB/GYN office.  Allergies  Allergen Reactions  . Imipramine Hcl Hives    Outpatient Encounter Medications as of 12/02/2020  Medication Sig Note  . levonorgestrel (MIRENA) 20 MCG/24HR IUD 1 each by Intrauterine route once.   Marland Kitchen losartan (COZAAR) 50 MG tablet Take 50 mg by mouth daily. Has not started med yet . Was told to take 25 mg half a tablet 01/28/2020: Has not started yet  . PARoxetine (PAXIL) 20 MG tablet Take 20 mg by mouth daily.   . clindamycin (CLEOCIN T) 1 % lotion Apply 1 application topically at bedtime.   . [DISCONTINUED] Benzoyl Peroxide (PANOXYL FOAMING WASH) 10 % LIQD Apply 1 application topically at bedtime.   . [DISCONTINUED] oxyCODONE (OXY IR/ROXICODONE) 5 MG immediate release tablet Take 1 tablet (5 mg total) by mouth every 6 (six) hours as needed for severe pain.    No facility-administered encounter medications on file as of 12/02/2020.     Past Medical History:  Diagnosis Date  . Anxiety   . Family history of adverse reaction to anesthesia    Mom slow to wake up and BP drops  . Headache    migraines  . History of kidney stones   . Hypertension    Has not started medication yet  . TMJ (dislocation of temporomandibular joint)     Past Surgical History:  Procedure Laterality Date  . CESAREAN SECTION    . FRACTURE SURGERY     jaw for TMJ    No family history on file.  Social History   Social History Narrative  . Not on file     Review of Systems General: Denies fevers,  chills, weight loss CV: Denies chest pain, shortness of breath, palpitations  Physical Exam Vitals with BMI 12/02/2020 02/06/2020 02/06/2020  Height 5\' 2"  - -  Weight 207 lbs - -  BMI 37.85 - -  Systolic 176 136  Diastolic 111 87 86  Pulse 111 97 99    General:  No acute distress,  Alert and oriented, Non-Toxic, Normal speech and affect Examination shows some tenderness in the left axilla.  I do not feel a discrete mass.  There is some swelling in that area it appears.  Assessment/Plan Patient has swelling and tenderness in the left axilla.  There is no overlying skin changes.  There may be a subcutaneous mass there but I would like to get an ultrasound to confirm.  We will send her for this and then I will see her back in the office to discuss the results.  All of her questions were answered.  616 12/02/2020, 9:05 AM

## 2020-12-08 NOTE — Addendum Note (Signed)
Addended by: Allena Napoleon on: 12/08/2020 10:59 AM   Modules accepted: Orders

## 2020-12-10 ENCOUNTER — Ambulatory Visit (HOSPITAL_BASED_OUTPATIENT_CLINIC_OR_DEPARTMENT_OTHER): Payer: 59

## 2020-12-13 ENCOUNTER — Ambulatory Visit (HOSPITAL_BASED_OUTPATIENT_CLINIC_OR_DEPARTMENT_OTHER)
Admission: RE | Admit: 2020-12-13 | Discharge: 2020-12-13 | Disposition: A | Discharge status: Home / Self Care | Payer: 59 | Source: Ambulatory Visit | Attending: Plastic Surgery | Admitting: Plastic Surgery

## 2020-12-13 ENCOUNTER — Other Ambulatory Visit: Payer: Self-pay

## 2020-12-13 DIAGNOSIS — R2232 Localized swelling, mass and lump, left upper limb: Secondary | ICD-10-CM | POA: Diagnosis present

## 2023-05-22 IMAGING — US US AXILLARY LEFT
1 series · 14 of 17 positions shown · non-contrast
Comparison: None.

CLINICAL DATA: Left axillary swelling, concern for mass

EXAM:
ULTRASOUND OF LEFT AXILLARY SOFT TISSUES
TECHNIQUE: Ultrasound examination of the axillary soft tissues was performed in
the area of clinical concern.

[Series 1: us axilla left · 17 acquisitions, 14 frames shown]
[im 1/17]
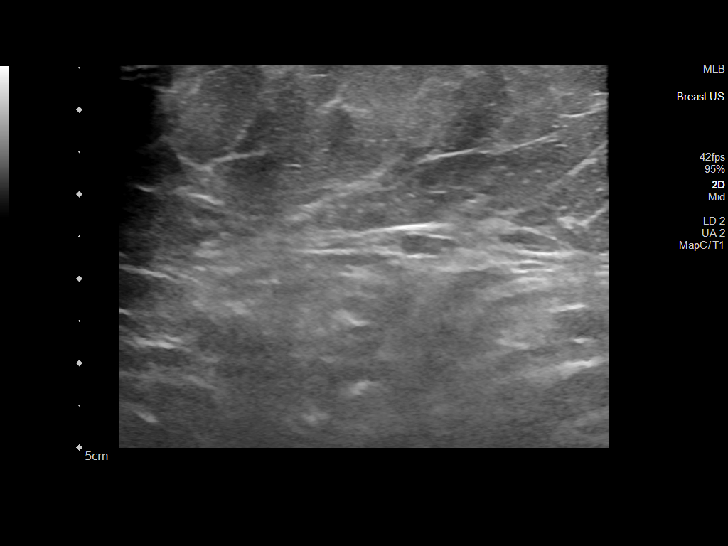
[im 2/17]
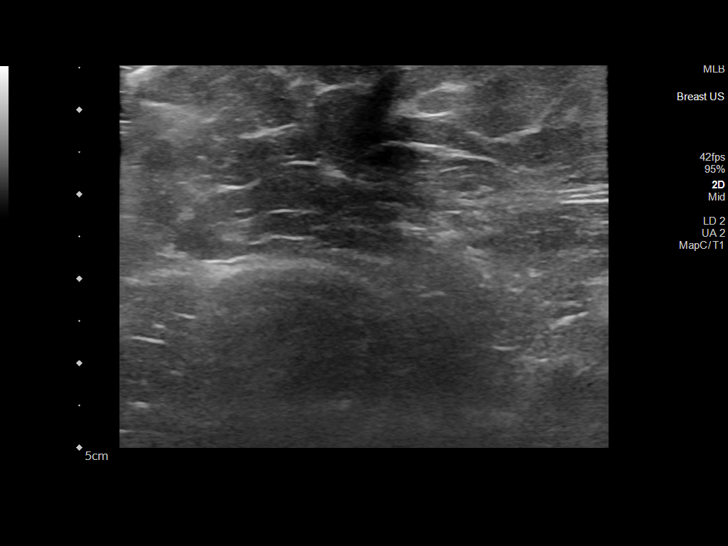
[im 4/17]
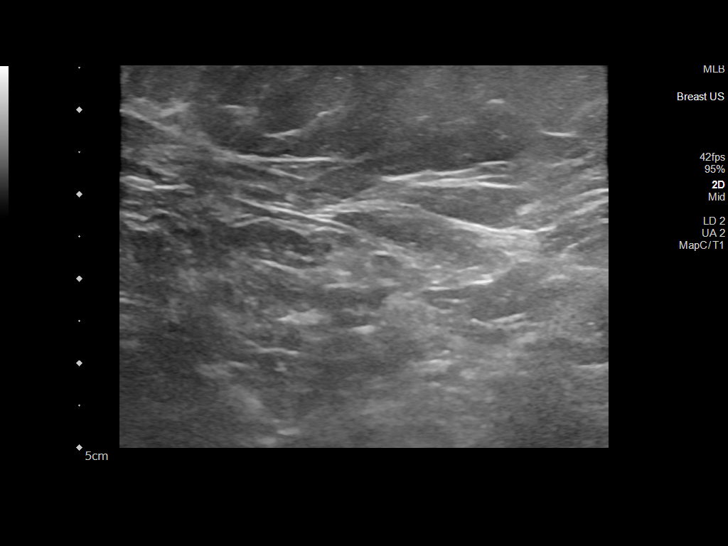
[im 5/17]
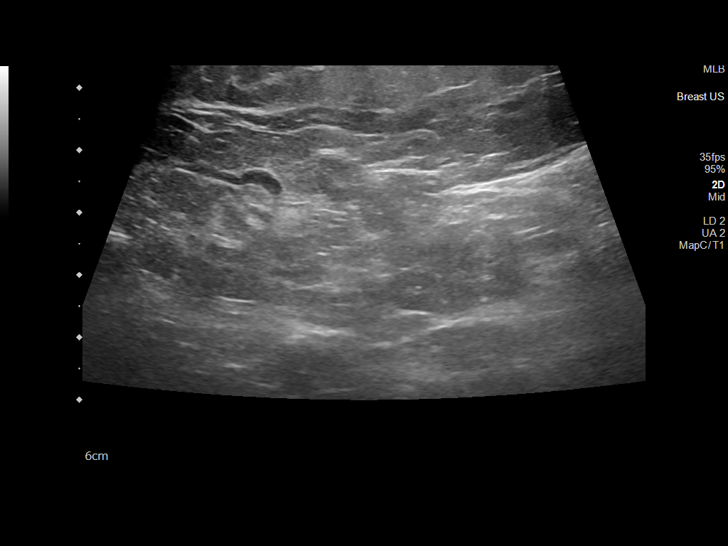
[im 6/17]
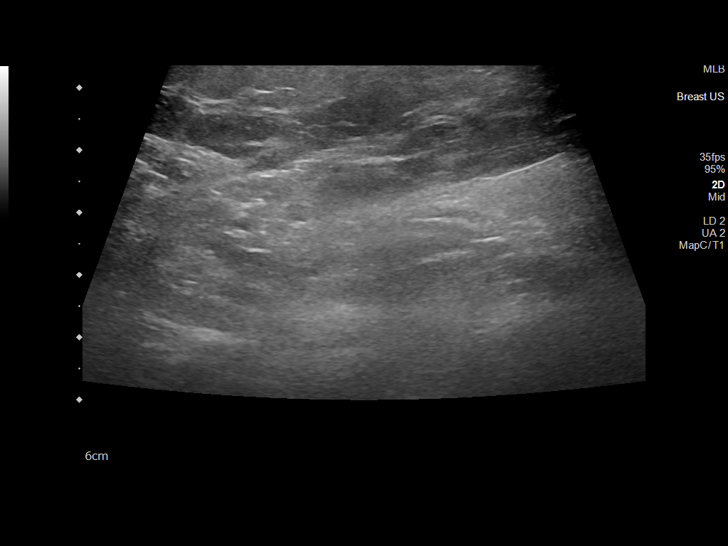
[im 7/17]
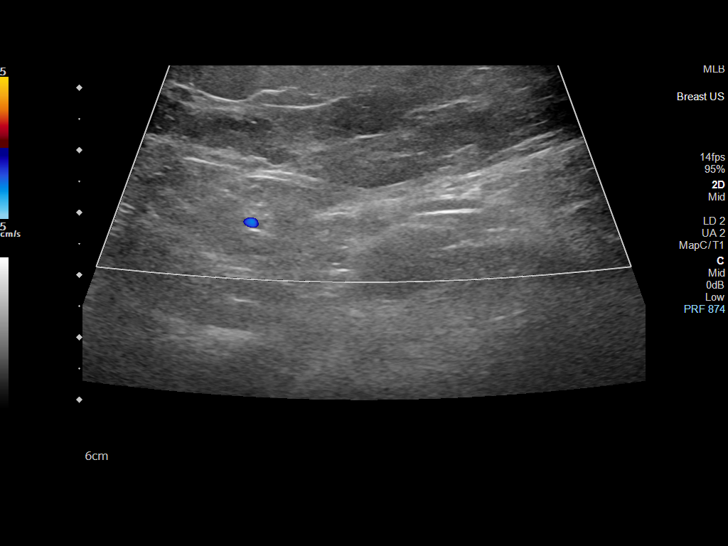
[im 8/17]
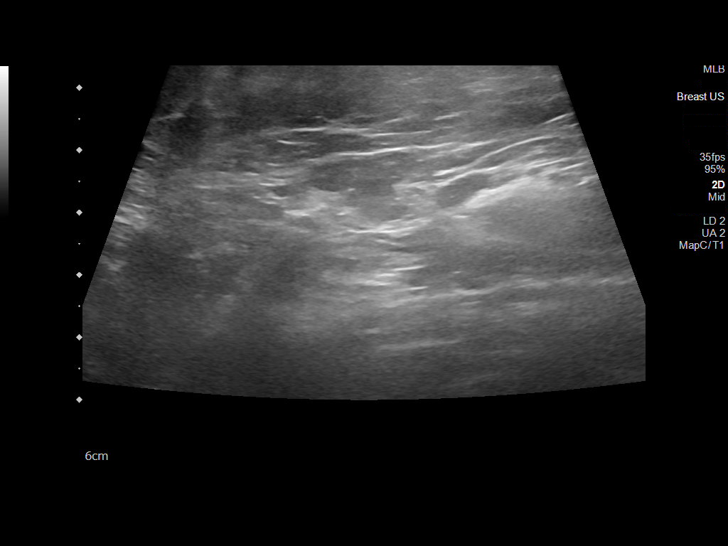
[im 10/17]
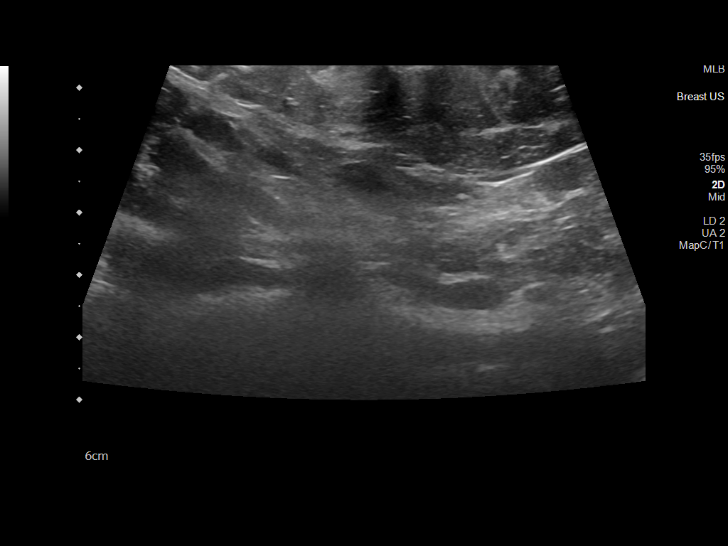
[im 11/17]
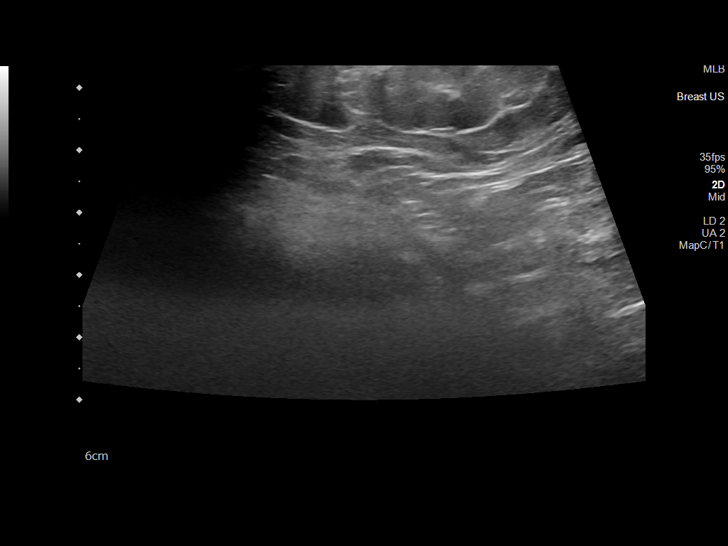
[im 12/17]
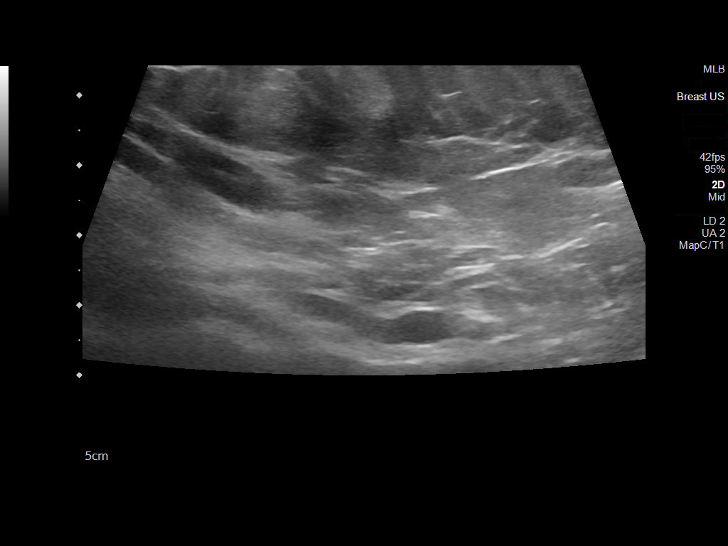
[im 13/17]
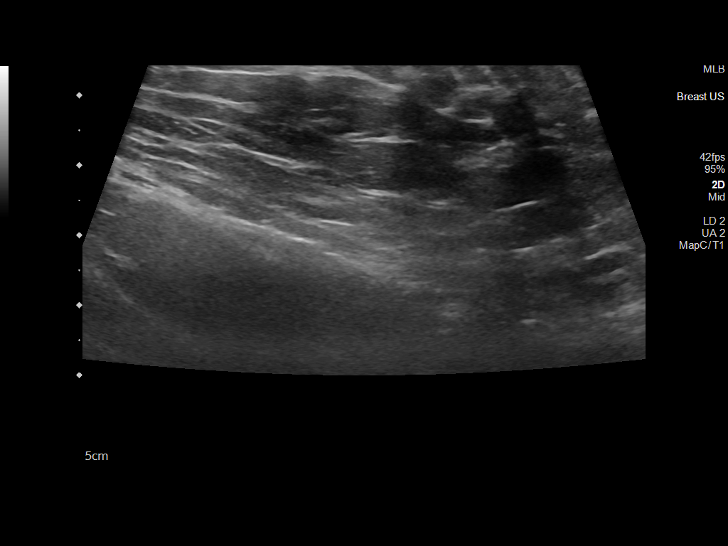
[im 14/17]
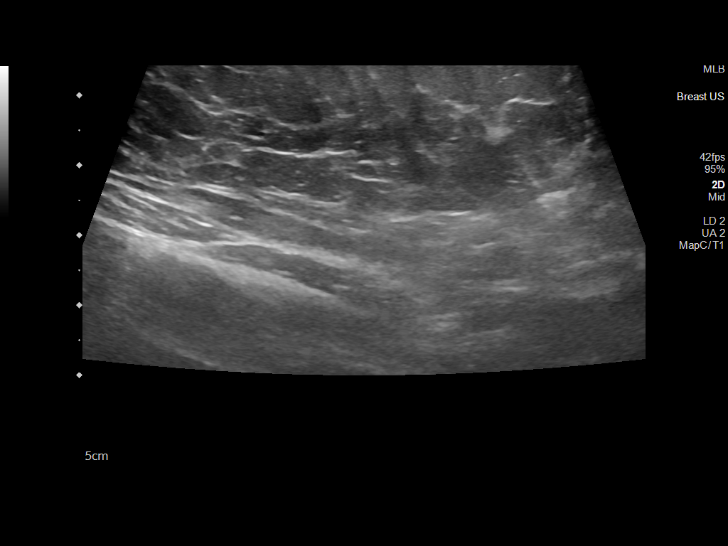
[im 16/17]
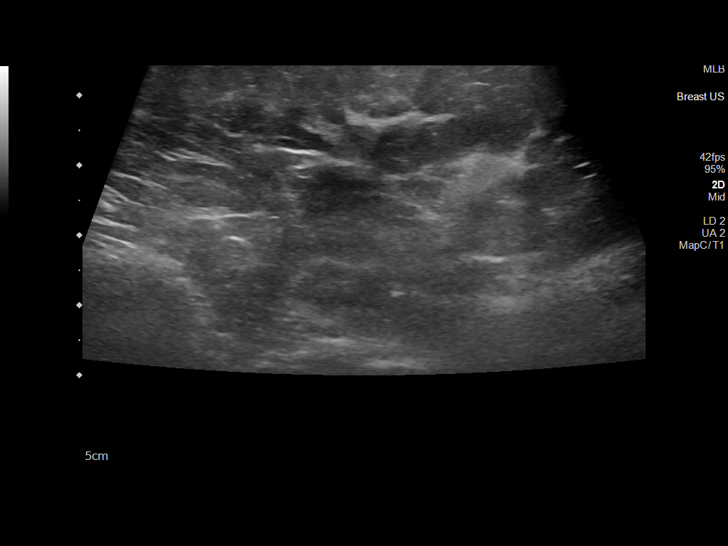
[im 17/17]
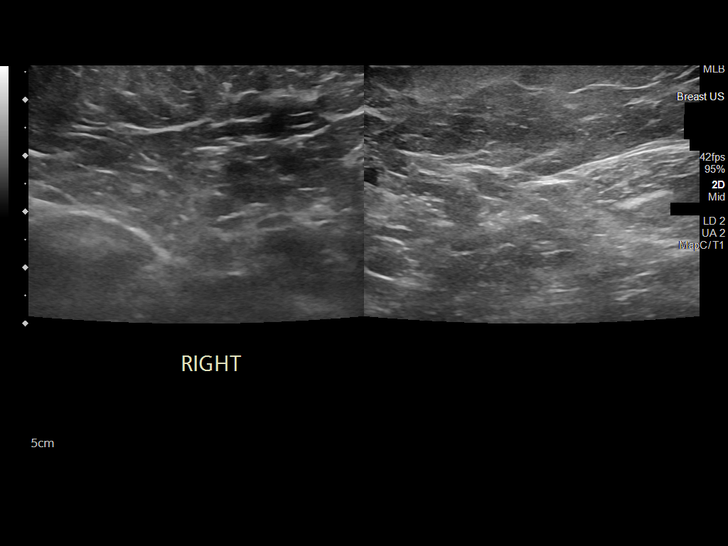

[14 of 17 positions shown; findings below may reference images not displayed]

FINDINGS: Targeted ultrasound was performed of the soft tissues of the left
axilla at site of patient's clinical concern. No well-defined solid
or cystic mass seen within the soft tissues. No abnormal lymph
nodes. No soft tissue edema or fluid collection. No hyperemia.
IMPRESSION: Unremarkable sonographic appearance of the left axillary soft
tissues.
